# Patient Record
Sex: Female | Born: 2000
Health system: Southern US, Community
[De-identification: ages and names within clinical notes are randomized; demographics above are authoritative.]

## PROBLEM LIST (undated history)

## (undated) DIAGNOSIS — R221 Localized swelling, mass and lump, neck: Secondary | ICD-10-CM

## (undated) DIAGNOSIS — G43909 Migraine, unspecified, not intractable, without status migrainosus: Secondary | ICD-10-CM

## (undated) DIAGNOSIS — F419 Anxiety disorder, unspecified: Secondary | ICD-10-CM

## (undated) DIAGNOSIS — R09A2 Foreign body sensation, throat: Secondary | ICD-10-CM

## (undated) DIAGNOSIS — E739 Lactose intolerance, unspecified: Secondary | ICD-10-CM

## (undated) HISTORY — DX: Migraine, unspecified, not intractable, without status migrainosus: G43.909

## (undated) HISTORY — DX: Foreign body sensation, throat: R09.A2

## (undated) HISTORY — DX: Localized swelling, mass and lump, neck: R22.1

---
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
## 2018-01-11 DIAGNOSIS — F411 Generalized anxiety disorder: Secondary | ICD-10-CM | POA: Diagnosis not present

## 2018-02-05 DIAGNOSIS — F419 Anxiety disorder, unspecified: Secondary | ICD-10-CM | POA: Diagnosis not present

## 2018-02-08 DIAGNOSIS — F411 Generalized anxiety disorder: Secondary | ICD-10-CM | POA: Diagnosis not present

## 2018-03-15 DIAGNOSIS — F411 Generalized anxiety disorder: Secondary | ICD-10-CM | POA: Diagnosis not present

## 2018-04-10 DIAGNOSIS — F411 Generalized anxiety disorder: Secondary | ICD-10-CM | POA: Diagnosis not present

## 2018-05-23 DIAGNOSIS — F411 Generalized anxiety disorder: Secondary | ICD-10-CM | POA: Diagnosis not present

## 2018-06-28 DIAGNOSIS — F419 Anxiety disorder, unspecified: Secondary | ICD-10-CM | POA: Diagnosis not present

## 2018-07-04 DIAGNOSIS — F411 Generalized anxiety disorder: Secondary | ICD-10-CM | POA: Diagnosis not present

## 2018-08-07 DIAGNOSIS — F411 Generalized anxiety disorder: Secondary | ICD-10-CM | POA: Diagnosis not present

## 2018-08-28 DIAGNOSIS — F411 Generalized anxiety disorder: Secondary | ICD-10-CM | POA: Diagnosis not present

## 2018-10-12 DIAGNOSIS — F411 Generalized anxiety disorder: Secondary | ICD-10-CM | POA: Diagnosis not present

## 2018-10-17 DIAGNOSIS — F419 Anxiety disorder, unspecified: Secondary | ICD-10-CM | POA: Diagnosis not present

## 2018-11-13 DIAGNOSIS — F411 Generalized anxiety disorder: Secondary | ICD-10-CM | POA: Diagnosis not present

## 2018-12-05 DIAGNOSIS — F411 Generalized anxiety disorder: Secondary | ICD-10-CM | POA: Diagnosis not present

## 2019-01-02 DIAGNOSIS — F411 Generalized anxiety disorder: Secondary | ICD-10-CM | POA: Diagnosis not present

## 2019-01-23 ENCOUNTER — Other Ambulatory Visit: Payer: Self-pay

## 2019-01-23 ENCOUNTER — Encounter: Payer: Self-pay | Admitting: Certified Nurse Midwife

## 2019-01-23 ENCOUNTER — Ambulatory Visit: Payer: BLUE CROSS/BLUE SHIELD | Admitting: Certified Nurse Midwife

## 2019-01-23 VITALS — BP 118/80 | HR 68 | Resp 16 | Ht 59.0 in | Wt 157.0 lb

## 2019-01-23 DIAGNOSIS — Z30011 Encounter for initial prescription of contraceptive pills: Secondary | ICD-10-CM

## 2019-01-23 DIAGNOSIS — Z01419 Encounter for gynecological examination (general) (routine) without abnormal findings: Secondary | ICD-10-CM

## 2019-01-23 MED ORDER — NORETHIN ACE-ETH ESTRAD-FE 1-20 MG-MCG PO TABS: 1.0000 | ORAL_TABLET | Freq: Every day | ORAL | 3 refills | Status: DC

## 2019-01-23 NOTE — Patient Instructions (Signed)
Oral Contraception Use  Oral contraceptive pills (OCPs) are medicines that you take to prevent pregnancy. OCPs work by:  · Preventing the ovaries from releasing eggs.  · Thickening mucus in the lower part of the uterus (cervix), which prevents sperm from entering the uterus.  · Thinning the lining of the uterus (endometrium), which prevents a fertilized egg from attaching to the endometrium.  OCPs are highly effective when taken exactly as prescribed. However, OCPs do not prevent sexually transmitted infections (STIs). Safe sex practices, such as using condoms while on an OCP, can help prevent STIs.  Before taking OCPs, you may have a physical exam, blood test, and Pap test. A Pap test involves taking a sample of cells from your cervix to check for cancer. Discuss with your health care provider the possible side effects of the OCP you may be prescribed. When you start an OCP, be aware that it can take 2-3 months for your body to adjust to changes in hormone levels.  How to take oral contraceptive pills  Follow instructions from your health care provider about how to start taking your first cycle of OCPs. Your health care provider may recommend that you:  · Start the pill on day 1 of your menstrual period. If you start at this time, you will not need any backup form of birth control (contraception), such as condoms.  · Start the pill on the first Sunday after your menstrual period or on the day you get your prescription. In these cases, you will need to use backup contraception for the first week.  · Start the pill at any time of your cycle.  ? If you take the pill within 5 days of the start of your period, you will not need a backup form of contraception.  ? If you start at any other time of your menstrual cycle, you will need to use another form of contraception for 7 days. If your OCP is the type called a minipill, it will protect you from pregnancy after taking it for 2 days (48 hours), and you can stop using  backup contraception after that time.  After you have started taking OCPs:  · If you forget to take 1 pill, take it as soon as you remember. Take the next pill at the regular time.  · If you miss 2 or more pills, call your health care provider. Different pills have different instructions for missed doses. Use backup birth control until your next menstrual period starts.  · If you use a 28-day pack that contains inactive pills and you miss 1 of the last 7 pills (pills with no hormones), throw away the rest of the non-hormone pills and start a new pill pack.  No matter which day you start the OCP, you will always start a new pack on that same day of the week. Have an extra pack of OCPs and a backup contraceptive method available in case you miss some pills or lose your OCP pack.  Follow these instructions at home:  · Do not use any products that contain nicotine or tobacco, such as cigarettes and e-cigarettes. If you need help quitting, ask your health care provider.  · Always use a condom to protect against STIs. OCPs do not protect against STIs.  · Use a calendar to mark the days of your menstrual period.  · Read the information and directions that came with your OCP. Talk to your health care provider if you have questions.  Contact a   health care provider if:  · You develop nausea and vomiting.  · You have abnormal vaginal discharge or bleeding.  · You develop a rash.  · You miss your menstrual period. Depending on the type of OCP you are taking, this may be a sign of pregnancy. Ask your health care provider for more information.  · You are losing your hair.  · You need treatment for mood swings or depression.  · You get dizzy when taking the OCP.  · You develop acne after taking the OCP.  · You become pregnant or think you may be pregnant.  · You have diarrhea, constipation, and abdominal pain or cramps.  · You miss 2 or more pills.  Get help right away if:  · You develop chest pain.  · You develop shortness of  breath.  · You have an uncontrolled or severe headache.  · You develop numbness or slurred speech.  · You develop visual or speech problems.  · You develop pain, redness, and swelling in your legs.  · You develop weakness or numbness in your arms or legs.  Summary  · Oral contraceptive pills (OCPs) are medicines that you take to prevent pregnancy.  · OCPs do not prevent sexually transmitted infections (STIs). Always use a condom to protect against STIs.  · When you start an OCP, be aware that it can take 2-3 months for your body to adjust to changes in hormone levels.  · Read all the information and directions that come with your OCP.  This information is not intended to replace advice given to you by your health care provider. Make sure you discuss any questions you have with your health care provider.  Document Released: 11/03/2011 Document Revised: 12/26/2016 Document Reviewed: 12/26/2016  Elsevier Interactive Patient Education © 2019 Elsevier Inc.

## 2019-01-23 NOTE — Progress Notes (Signed)
18 y.o. G0P0000 Single  Caucasian Fe here for annual exam. Now attending [employer] and working part time. Sexually active, oral only. Dr. Kizzie Bane manages her Prozac and Vistaril, working well. Sees Urgent care if needed.Interested in contraception OCP now, she has consistent partner. No STD screening needed at this point. She has read about OCP and feels this would be good option. No other health issues today.  No LMP recorded.          Sexually active: only oral not vaginal intercourse  The current method of family planning is abstinence.    Exercising: No.  exercise Smoker:  no  Review of Systems  Constitutional: Negative.   HENT: Negative.   Eyes: Negative.   Respiratory: Negative.   Cardiovascular: Negative.   Gastrointestinal: Negative.   Genitourinary: Negative.   Musculoskeletal: Negative.   Skin: Negative.   Neurological: Negative.   Endo/Heme/Allergies: Negative.   Psychiatric/Behavioral: Negative.     Health Maintenance: Pap:  none History of Abnormal Pap: no MMG:  none Self Breast exams: no Colonoscopy:  none BMD:   none TDaP: 2013 Shingles: no Pneumonia: no Hep C and HIV: not done Labs: if needed   reports that she has never smoked. She has never used smokeless tobacco. She reports that she does not drink alcohol or use drugs.  Past Medical History:  Diagnosis Date  . Anxiety   . Lactose intolerance   . Sensation of lump in throat     No past surgical history on file.  Current Outpatient Medications  Medication Sig Dispense Refill  . cetirizine (ZYRTEC) 10 MG tablet Take 10 mg by mouth as needed for allergies.    Marland Kitchen FLUoxetine (PROZAC) 20 MG capsule Take 20 mg by mouth daily.    . hydrOXYzine (VISTARIL) 25 MG capsule TAKE 1 TABLET EVERY 6 HOURS AS NEEDED FOR ANXIETY  1   No current facility-administered medications for this visit.     Family History  Problem Relation Age of Onset  . Ulcers Paternal Uncle   . Osteopenia Mother   . Hypertension Father    . Uterine cancer Maternal Grandmother 25  . Heart disease Maternal Grandfather        quad bypass  . Alzheimer's disease Paternal Grandfather     ROS:  Pertinent items are noted in HPI.  Otherwise, a comprehensive ROS was negative.  Exam:   There were no vitals taken for this visit.   Ht Readings from Last 3 Encounters:  03/08/16 4' 11.25" (1.505 m) (3 %, Z= -1.92)*  09/25/13 4' 10.9" (1.496 m) (3 %, Z= -1.82)*  09/16/13 4' 10.5" (1.486 m) (2 %, Z= -1.97)*   * Growth percentiles are based on CDC (Girls, 2-20 Years) data.    General appearance: alert, cooperative and appears stated age Head: Normocephalic, without obvious abnormality, atraumatic Neck: no adenopathy, supple, symmetrical, trachea midline and thyroid normal to inspection and palpation Lungs: clear to auscultation bilaterally Breasts: normal appearance, no masses or tenderness, No nipple retraction or dimpling, No nipple discharge or bleeding, No axillary or supraclavicular adenopathy, Taught monthly breast self examination Heart: regular rate and rhythm Abdomen: soft, non-tender; no masses,  no organomegaly Extremities: extremities normal, atraumatic, no cyanosis or edema Skin: Skin color, texture, turgor normal. No rashes or lesions Lymph nodes: Cervical, supraclavicular, and axillary nodes normal. No abnormal inguinal nodes palpated Neurologic: Grossly normal   Pelvic: External genitalia:  no lesions,  Normal female  Urethra:  normal appearing urethra with no masses, tenderness or lesions              Bartholin's and Skene's: normal                 Vagina: normal appearing vagina with normal color and discharge, no lesions              Cervix: no cervical motion tenderness, no lesions and nulliparous appearance              Pap taken: No. Bimanual Exam:  Uterus:  normal size, contour, position, consistency, mobility, non-tender and anteverted              Adnexa: normal adnexa and no mass, fullness,  tenderness               Rectovaginal: Confirms               Anus:  normal appearance, no lesions  Chaperone present: yes  A:  Well Woman with normal exam  Contraception desired for cycle control as well as contraception, requests OCP  Anxiety management with MD, stable medication    P:   Reviewed health and wellness pertinent to exam  Discussed risks/benefits/warning signs and expectations for use OCP.   Rx Junel  1/20 Fe, see order with instructions to start today, on menses . Needs follow up visit in 3 months to assess OCP status.  Pap smear: no   counseled on breast self exam, STD prevention, HIV risk factors and prevention, feminine hygiene, use and side effects of OCP's, adequate intake of calcium and vitamin D, diet and exercise  return annually or prn, as above  An After Visit Summary was printed and given to the patient.

## 2019-01-30 DIAGNOSIS — F411 Generalized anxiety disorder: Secondary | ICD-10-CM | POA: Diagnosis not present

## 2019-02-14 DIAGNOSIS — F411 Generalized anxiety disorder: Secondary | ICD-10-CM | POA: Diagnosis not present

## 2019-02-27 DIAGNOSIS — F411 Generalized anxiety disorder: Secondary | ICD-10-CM | POA: Diagnosis not present

## 2019-03-13 DIAGNOSIS — F411 Generalized anxiety disorder: Secondary | ICD-10-CM | POA: Diagnosis not present

## 2019-03-27 DIAGNOSIS — F411 Generalized anxiety disorder: Secondary | ICD-10-CM | POA: Diagnosis not present

## 2019-04-18 ENCOUNTER — Other Ambulatory Visit: Payer: Self-pay

## 2019-04-18 ENCOUNTER — Ambulatory Visit: Payer: BLUE CROSS/BLUE SHIELD | Admitting: Obstetrics and Gynecology

## 2019-04-18 ENCOUNTER — Encounter: Payer: Self-pay | Admitting: Obstetrics and Gynecology

## 2019-04-18 DIAGNOSIS — Z3041 Encounter for surveillance of contraceptive pills: Secondary | ICD-10-CM | POA: Diagnosis not present

## 2019-04-18 MED ORDER — NORETHIN ACE-ETH ESTRAD-FE 1-20 MG-MCG PO TABS: 1.0000 | ORAL_TABLET | Freq: Every day | ORAL | 2 refills | Status: DC

## 2019-04-18 MED ORDER — IBUPROFEN 800 MG PO TABS: 800.0000 mg | ORAL_TABLET | Freq: Three times a day (TID) | ORAL | 1 refills | Status: AC | PRN

## 2019-04-18 NOTE — Progress Notes (Signed)
GYNECOLOGY  VISIT   HPI: 18 y.o.   Single  Caucasian  female   G0P0000 with Patient's last menstrual period was 04/12/2019.   here for 3 month med recheck. Patient states that she has been a emotional but overall doing well with the pills.    States her menses are lighter and shorter.   3 - 5 days instead of 5 - 7 days.  Using a menstrual cup also.  Cycles are consistent.  Still has some cramping but lasts for 1 - 2 days only.  Midol works to treat cramping.   Thinks she had a migraine this week.  States the headache would come and go.  She had light sensitivity.  No hx of migraines.   Feeling more emotional in the month of April.  Separated from boyfriend during Covid 9019.  Feels like this is better during the last 2 weeks.   Taking Prozac through psychiatry.  Student at [employer].  Works at [employer].   GYNECOLOGIC HISTORY: Patient's last menstrual period was 04/12/2019. Contraception: JUNEL FE Menopausal hormone therapy:  n/a Last mammogram:  n/a Last pap smear:   n/a        OB History    Gravida  0   Para  0   Term  0   Preterm  0   AB  0   Living  0     SAB  0   TAB  0   Ectopic  0   Multiple  0   Live Births                 Patient Active Problem List   Diagnosis Date Noted  . Sensation of lump in throat     Past Medical History:  Diagnosis Date  . Anxiety   . Lactose intolerance   . Sensation of lump in throat     No past surgical history on file.  Current Outpatient Medications  Medication Sig Dispense Refill  . cetirizine (ZYRTEC) 10 MG tablet Take 10 mg by mouth as needed for allergies.    Marland Kitchen. FLUoxetine (PROZAC) 20 MG capsule Take 20 mg by mouth daily.    . hydrOXYzine (VISTARIL) 25 MG capsule TAKE 1 TABLET EVERY 6 HOURS AS NEEDED FOR ANXIETY  1  . norethindrone-ethinyl estradiol (JUNEL FE,GILDESS FE,LOESTRIN FE) 1-20 MG-MCG tablet Take 1 tablet by mouth daily. 1 Package 3   No current facility-administered medications for  this visit.      ALLERGIES: Banana  Family History  Problem Relation Age of Onset  . Uterine cancer Maternal Grandmother [id number]  . Heart disease Maternal Grandfather        quad bypass  . Alzheimer's disease Paternal Grandfather   . Ulcers Paternal Uncle   . Osteopenia Mother   . Hypertension Father     Social History   Socioeconomic History  . Marital status: Single    Spouse name: Not on file  . Number of children: Not on file  . Years of education: Not on file  . Highest education level: Not on file  Occupational History  . Not on file  Social Needs  . Financial resource strain: Not on file  . Food insecurity:    Worry: Not on file    Inability: Not on file  . Transportation needs:    Medical: Not on file    Non-medical: Not on file  Tobacco Use  . Smoking status: Never Smoker  . Smokeless tobacco: Never Used  Substance and Sexual Activity  . Alcohol use: No    Alcohol/week: 0.0 standard drinks  . Drug use: No  . Sexual activity: Never    Partners: Male    Birth control/protection: Abstinence    Comment: oral but not vaginal  Lifestyle  . Physical activity:    Days per week: Not on file    Minutes per session: Not on file  . Stress: Not on file  Relationships  . Social connections:    Talks on phone: Not on file    Gets together: Not on file    Attends religious service: Not on file    Active member of club or organization: Not on file    Attends meetings of clubs or organizations: Not on file    Relationship status: Not on file  . Intimate partner violence:    Fear of current or ex partner: Not on file    Emotionally abused: Not on file    Physically abused: Not on file    Forced sexual activity: Not on file  Other Topics Concern  . Not on file  Social History Narrative   6th grade 2014-2015    Review of Systems  Constitutional: Negative.   HENT: Negative.   Eyes: Negative.   Respiratory: Negative.   Cardiovascular: Negative.    Gastrointestinal: Negative.   Endocrine: Negative.   Genitourinary: Negative.   Musculoskeletal: Negative.   Skin: Negative.   Allergic/Immunologic: Negative.   Neurological: Negative.   Hematological: Negative.   Psychiatric/Behavioral: Negative.     PHYSICAL EXAMINATION:    BP 120/76 (BP Location: Right Arm, Patient Position: Sitting, Cuff Size: Large)   Pulse 84   Temp (!) 97.4 F (36.3 C) (Temporal)   Resp 14   Ht 4\' 11"  (1.499 m)   Wt 161 lb 9.6 oz (73.3 kg)   LMP 04/12/2019   BMI 32.64 kg/m     General appearance: alert, cooperative and appears stated age   ASSESSMENT  COC monitoring.  Isolated migraine HA. Dysmenorrhea.   PLAN  Motrin 800 mg po q 8 hour prn.  Refill of LoEstrin 1/20 3 packs and 2 refills.  She will contact the office if she develops migraines with aura.  I discussed increased risk of stroke in this circumstance and the need for switch to a nonestrogen containing method of contraception.  FU for annual exam with Sara Chu in Feb. 2021 and prn.    An After Visit Summary was printed and given to the patient.  ____15__ minutes face to face time of which over 50% was spent in counseling.

## 2019-04-24 ENCOUNTER — Ambulatory Visit: Payer: BLUE CROSS/BLUE SHIELD | Admitting: Certified Nurse Midwife

## 2019-04-24 DIAGNOSIS — F411 Generalized anxiety disorder: Secondary | ICD-10-CM | POA: Diagnosis not present

## 2019-05-01 ENCOUNTER — Other Ambulatory Visit: Payer: Self-pay | Admitting: Certified Nurse Midwife

## 2019-05-01 DIAGNOSIS — Z3041 Encounter for surveillance of contraceptive pills: Secondary | ICD-10-CM

## 2019-05-01 NOTE — Telephone Encounter (Signed)
Prescription sent to [facility] 04/18/19; VM left for patient to call me back at [phone] to confirm which pharmacy is correct for medication.

## 2019-05-01 NOTE — Telephone Encounter (Signed)
Spoke with patient regarding refill request. Prescription was sent to the right pharmacy ([facility]). Request from [facility] has been refused. Closing encounter.

## 2019-05-22 DIAGNOSIS — F411 Generalized anxiety disorder: Secondary | ICD-10-CM | POA: Diagnosis not present

## 2019-05-24 DIAGNOSIS — G43909 Migraine, unspecified, not intractable, without status migrainosus: Secondary | ICD-10-CM | POA: Diagnosis not present

## 2019-06-12 DIAGNOSIS — F411 Generalized anxiety disorder: Secondary | ICD-10-CM | POA: Diagnosis not present

## 2019-06-17 DIAGNOSIS — R51 Headache: Secondary | ICD-10-CM | POA: Diagnosis not present

## 2019-07-03 DIAGNOSIS — F411 Generalized anxiety disorder: Secondary | ICD-10-CM | POA: Diagnosis not present

## 2019-07-05 DIAGNOSIS — G43909 Migraine, unspecified, not intractable, without status migrainosus: Secondary | ICD-10-CM | POA: Diagnosis not present

## 2019-07-15 ENCOUNTER — Other Ambulatory Visit: Payer: Self-pay

## 2019-07-15 ENCOUNTER — Ambulatory Visit: Payer: BC Managed Care – PPO | Admitting: Certified Nurse Midwife

## 2019-07-15 ENCOUNTER — Encounter: Payer: Self-pay | Admitting: Certified Nurse Midwife

## 2019-07-15 VITALS — BP 110/68 | HR 64 | Temp 97.0°F | Resp 16 | Wt 163.0 lb

## 2019-07-15 DIAGNOSIS — Z3041 Encounter for surveillance of contraceptive pills: Secondary | ICD-10-CM | POA: Diagnosis not present

## 2019-07-15 DIAGNOSIS — G43009 Migraine without aura, not intractable, without status migrainosus: Secondary | ICD-10-CM

## 2019-07-15 MED ORDER — NORETHINDRONE 0.35 MG PO TABS
1.0000 | ORAL_TABLET | Freq: Every day | ORAL | 2 refills | Status: DC
Start: 2019-07-15 — End: 2019-09-23

## 2019-07-15 NOTE — Progress Notes (Signed)
Review of Systems  Constitutional: Negative.   HENT: Negative.   Eyes:       Wears glasses and prescription is up to date, no eye change  Respiratory: Negative.   Cardiovascular: Negative.   Gastrointestinal: Negative.   Genitourinary: Negative.   Musculoskeletal: Negative.   Skin: Negative.   Neurological: Positive for headaches.       One headache weekly, frontal, no aura, or vision change or nausea  Psychiatric/Behavioral: Negative.     18 y.o. Single Caucasian G0P0000here for evaluation of OCP. Menses duration 3-4 days with moderate to light flow. Patient taking medication as prescribed. Patient has noted headaches with new pack each month and increase in occurrence, once weekly. Denies aura, but is frontal. She saw PCP who felt were related to OCP.  Denies missed pills, aura with  headaches,slight nausea only.Occur with new pack of OCP. Would like to change contraception to see if will resolve. Not sexually active at present. Keeping menses calendar. Would like to stay on pills because she is familiar with them and consistent about taking. No other health issues today  O: Healthy female, WD WN Affect: normal orientation X 3    A: History of new headache occurrence on OCP. Migraine headache no aura with PCP management. Contraception change desired.  P: Discussed concerns with estrogen containing OCP and migraines. Patient is aware of warning signs with migraines. Discussed options of Depo Provera, POP, Nexplanon, IUD. She would prefer POP trial. Expectations with bleeding profile discussed. She has just started her new pack and will stop and start on Micronor. If sexually active needs to use condom back up for protection the first month of change. Rx Micronor  See order with instructions. Patient to call and advise if headaches have resolved or needs to schedule appointment if not adjusting well. Questions addressed.  22 minutes in face to face discussion regarding contraception  management  Rv prn

## 2019-07-15 NOTE — Patient Instructions (Signed)

## 2019-07-25 DIAGNOSIS — F411 Generalized anxiety disorder: Secondary | ICD-10-CM | POA: Diagnosis not present

## 2019-08-15 DIAGNOSIS — F419 Anxiety disorder, unspecified: Secondary | ICD-10-CM | POA: Diagnosis not present

## 2019-08-15 DIAGNOSIS — F411 Generalized anxiety disorder: Secondary | ICD-10-CM | POA: Diagnosis not present

## 2019-08-15 DIAGNOSIS — F4322 Adjustment disorder with anxiety: Secondary | ICD-10-CM | POA: Diagnosis not present

## 2019-09-23 ENCOUNTER — Other Ambulatory Visit: Payer: Self-pay | Admitting: Certified Nurse Midwife

## 2019-09-23 DIAGNOSIS — Z3041 Encounter for surveillance of contraceptive pills: Secondary | ICD-10-CM

## 2019-09-23 MED ORDER — NORETHINDRONE 0.35 MG PO TABS: 1.0000 | ORAL_TABLET | Freq: Every day | ORAL | 5 refills | Status: DC

## 2019-09-23 NOTE — Telephone Encounter (Signed)
Patient is calling requesting a refill for norethindrone. Patient stated that she "rarely" gets headaches since starting the medication and would like to continue with it.

## 2019-09-23 NOTE — Telephone Encounter (Signed)
aex was 01-23-2019. 52mth supply with 0 refills was given 07-15-2019. Please approve refill if appropriate.

## 2019-10-07 DIAGNOSIS — F411 Generalized anxiety disorder: Secondary | ICD-10-CM | POA: Diagnosis not present

## 2019-12-26 DIAGNOSIS — F411 Generalized anxiety disorder: Secondary | ICD-10-CM | POA: Diagnosis not present

## 2020-01-23 DIAGNOSIS — F411 Generalized anxiety disorder: Secondary | ICD-10-CM | POA: Diagnosis not present

## 2020-01-28 ENCOUNTER — Other Ambulatory Visit: Payer: Self-pay

## 2020-01-29 ENCOUNTER — Other Ambulatory Visit (HOSPITAL_COMMUNITY)
Admission: RE | Admit: 2020-01-29 | Discharge: 2020-01-29 | Disposition: A | Discharge status: Home / Self Care | Payer: BC Managed Care – PPO | Source: Ambulatory Visit | Attending: Certified Nurse Midwife | Admitting: Certified Nurse Midwife

## 2020-01-29 ENCOUNTER — Other Ambulatory Visit: Payer: Self-pay

## 2020-01-29 ENCOUNTER — Ambulatory Visit: Payer: BC Managed Care – PPO | Admitting: Certified Nurse Midwife

## 2020-01-29 ENCOUNTER — Encounter: Payer: Self-pay | Admitting: Certified Nurse Midwife

## 2020-01-29 VITALS — BP 120/70 | HR 72 | Temp 98.4°F | Resp 16 | Ht 59.25 in | Wt 171.0 lb

## 2020-01-29 DIAGNOSIS — Z01419 Encounter for gynecological examination (general) (routine) without abnormal findings: Secondary | ICD-10-CM

## 2020-01-29 DIAGNOSIS — Z3041 Encounter for surveillance of contraceptive pills: Secondary | ICD-10-CM

## 2020-01-29 DIAGNOSIS — Z124 Encounter for screening for malignant neoplasm of cervix: Secondary | ICD-10-CM

## 2020-01-29 MED ORDER — NORETHINDRONE 0.35 MG PO TABS: 1.0000 | ORAL_TABLET | Freq: Every day | ORAL | 11 refills | Status: DC

## 2020-01-29 NOTE — Progress Notes (Signed)
19 y.o. G0P0000 Single  Caucasian Fe here for annual exam. Periods regular 3-4 days with no cramping. Micronor working well for cycle control. Headaches have improved and no more migraines. Never sexually active. Using Diva cup for menses..Busy with college. No health issues today.   Patient's last menstrual period was 01/22/2020 (exact date).          Sexually active: No. never sexually active The current method of family planning is oral progesterone-only contraceptive.    Exercising: No.  exercise Smoker:  no  Review of Systems  Constitutional: Negative.   HENT: Negative.   Eyes: Negative.   Respiratory: Negative.   Cardiovascular: Negative.   Gastrointestinal: Negative.   Genitourinary: Negative.   Musculoskeletal: Negative.   Skin: Negative.   Neurological: Negative.   Endo/Heme/Allergies: Negative.   Psychiatric/Behavioral: Negative.     Health Maintenance: Pap:  none History of Abnormal Pap: no MMG:  none Self Breast exams: no Colonoscopy:  none BMD:   none TDaP:  2013 Shingles: no Pneumonia: no Hep C and HIV: not done Labs: if needed   reports that she has never smoked. She has never used smokeless tobacco. She reports that she does not drink alcohol or use drugs.  Past Medical History:  Diagnosis Date  . Anxiety   . Lactose intolerance   . Migraines   . Sensation of lump in throat     History reviewed. No pertinent surgical history.  Current Outpatient Medications  Medication Sig Dispense Refill  . cetirizine (ZYRTEC) 10 MG tablet Take 10 mg by mouth as needed for allergies.    Marland Kitchen FLUoxetine (PROZAC) 20 MG tablet Take 20 mg by mouth daily.    . hydrOXYzine (VISTARIL) 25 MG capsule TAKE 1 TABLET EVERY 6 HOURS AS NEEDED FOR ANXIETY  1  . ibuprofen (ADVIL) 800 MG tablet Take 1 tablet (800 mg total) by mouth every 8 (eight) hours as needed. 90 tablet 1  . norethindrone (MICRONOR) 0.35 MG tablet Take 1 tablet (0.35 mg total) by mouth daily. 1 Package 5   No  current facility-administered medications for this visit.    Family History  Problem Relation Age of Onset  . Uterine cancer Maternal Grandmother 48  . Heart disease Maternal Grandfather        quad bypass  . Alzheimer's disease Paternal Grandfather   . Ulcers Paternal Uncle   . Osteopenia Mother   . Hypertension Father     ROS:  Pertinent items are noted in HPI.  Otherwise, a comprehensive ROS was negative.  Exam:   BP 120/70   Pulse 72   Temp 98.4 F (36.9 C) (Skin)   Resp 16   Ht 4' 11.25" (1.505 m)   Wt 171 lb (77.6 kg)   LMP 01/22/2020 (Exact Date)   BMI 34.25 kg/m  Height: 4' 11.25" (150.5 cm) Ht Readings from Last 3 Encounters:  21/03/21 4' 11.25" (1.505 m)  04/18/19 4\' 11"  (1.499 m)  01/23/19 4\' 11"  (1.499 m) (2 %, Z= -2.07)*   * Growth percentiles are based on CDC (Girls, 2-20 Years) data.    General appearance: alert, cooperative and appears stated age Head: Normocephalic, without obvious abnormality, atraumatic Neck: no adenopathy, supple, symmetrical, trachea midline and thyroid normal to inspection and palpation Lungs: clear to auscultation bilaterally Breasts: normal appearance, no masses or tenderness, No nipple retraction or dimpling, No nipple discharge or bleeding, No axillary or supraclavicular adenopathy, Taught monthly breast self examination Heart: regular rate and rhythm Abdomen: soft, non-tender;  no masses,  no organomegaly Extremities: extremities normal, atraumatic, no cyanosis or edema Skin: Skin color, texture, turgor normal. No rashes or lesions Lymph nodes: Cervical, supraclavicular, and axillary nodes normal. No abnormal inguinal nodes palpated Neurologic: Grossly normal   Pelvic: External genitalia:  no lesions              Urethra:  normal appearing urethra with no masses, tenderness or lesions              Bartholin's and Skene's: normal                 Vagina: normal appearing vagina with normal color and discharge, no lesions               Cervix: no cervical motion tenderness, no lesions and nulliparous appearance              Pap taken: Yes.   Bimanual Exam:  Uterus:  normal size, contour, position, consistency, mobility, non-tender and anteverted              Adnexa: normal adnexa and no mass, fullness, tenderness               Rectovaginal: Confirms               Anus:  normal appearance, no lesions  Chaperone present: yes  A:  Well Woman with normal exam  Contraception Micronor  History of Migraine headaches, no aura  Weight gain  P:   Reviewed health and wellness pertinent to exam  Risks/benefits/warning signs with use and bleeding expectations reviewed, happy with choice  Rx Micronor see order with instructions  Discussed Migraine aura and this occurs needs to be seen by headache specialist. Patient denies aura ever.  Encouraged to work on healthy food choices to control weight gain.  Pap smear: yes   counseled on breast self exam, STD prevention, HIV risk factors and prevention, feminine hygiene, use and side effects of OCP's, adequate intake of calcium and vitamin D, diet and exercise  return annually or prn  An After Visit Summary was printed and given to the patient.

## 2020-01-29 NOTE — Patient Instructions (Signed)
General topics  Next pap or exam is  due in 1 year Take a Women's multivitamin Take 1200 mg. of calcium daily - prefer dietary If any concerns in interim to call back  Breast Self-Awareness Practicing breast self-awareness may pick up problems early, prevent significant medical complications, and possibly save your life. By practicing breast self-awareness, you can become familiar with how your breasts look and feel and if your breasts are changing. This allows you to notice changes early. It can also offer you some reassurance that your breast health is good. One way to learn what is normal for your breasts and whether your breasts are changing is to do a breast self-exam. If you find a lump or something that was not present in the past, it is best to contact your caregiver right away. Other findings that should be evaluated by your caregiver include nipple discharge, especially if it is bloody; skin changes or reddening; areas where the skin seems to be pulled in (retracted); or new lumps and bumps. Breast pain is seldom associated with cancer (malignancy), but should also be evaluated by a caregiver. BREAST SELF-EXAM The best time to examine your breasts is 5 7 days after your menstrual period is over.  ExitCare Patient Information 2013 [employer].   Exercise to Stay Healthy Exercise helps you become and stay healthy. EXERCISE IDEAS AND TIPS Choose exercises that:  You enjoy.  Fit into your day. You do not need to exercise really hard to be healthy. You can do exercises at a slow or medium level and stay healthy. You can:  Stretch before and after working out.  Try yoga, Pilates, or tai chi.  Lift weights.  Walk fast, swim, jog, run, climb stairs, bicycle, dance, or rollerskate.  Take aerobic classes. Exercises that burn about 150 calories:  Running 1  miles in 15 minutes.  Playing volleyball for 45 to 60 minutes.  Washing and waxing a car for 45 to 60  minutes.  Playing touch football for 45 minutes.  Walking 1  miles in 35 minutes.  Pushing a stroller 1  miles in 30 minutes.  Playing basketball for 30 minutes.  Raking leaves for 30 minutes.  Bicycling 5 miles in 30 minutes.  Walking 2 miles in 30 minutes.  Dancing for 30 minutes.  Shoveling snow for 15 minutes.  Swimming laps for 20 minutes.  Walking up stairs for 15 minutes.  Bicycling 4 miles in 15 minutes.  Gardening for 30 to 45 minutes.  Jumping rope for 15 minutes.  Washing windows or floors for 45 to 60 minutes. Document Released: 12/17/2010 Document Revised: 02/06/2012 Document Reviewed: 12/17/2010 ExitCare Patient Information 2013 [employer].   Other topics ( that may be useful information):    Sexually Transmitted Disease Sexually transmitted disease (STD) refers to any infection that is passed from person to person during sexual activity. This may happen by way of saliva, semen, blood, vaginal mucus, or urine. Common STDs include:  Gonorrhea.  Chlamydia.  Syphilis.  HIV/AIDS.  Genital herpes.  Hepatitis B and C.  Trichomonas.  Human papillomavirus (HPV).  Pubic lice. CAUSES  An STD may be spread by bacteria, virus, or parasite. A person can get an STD by:  Sexual intercourse with an infected person.  Sharing sex toys with an infected person.  Sharing needles with an infected person.  Having intimate contact with the genitals, mouth, or rectal areas of an infected person. SYMPTOMS  Some people may not have any symptoms, but   they can still pass the infection to others. Different STDs have different symptoms. Symptoms include:  Painful or bloody urination.  Pain in the pelvis, abdomen, vagina, anus, throat, or eyes.  Skin rash, itching, irritation, growths, or sores (lesions). These usually occur in the genital or anal area.  Abnormal vaginal discharge.  Penile discharge in men.  Soft, flesh-colored skin growths in the  genital or anal area.  Fever.  Pain or bleeding during sexual intercourse.  Swollen glands in the groin area.  Yellow skin and eyes (jaundice). This is seen with hepatitis. DIAGNOSIS  To make a diagnosis, your caregiver may:  Take a medical history.  Perform a physical exam.  Take a specimen (culture) to be examined.  Examine a sample of discharge under a microscope.  Perform blood test TREATMENT   Chlamydia, gonorrhea, trichomonas, and syphilis can be cured with antibiotic medicine.  Genital herpes, hepatitis, and HIV can be treated, but not cured, with prescribed medicines. The medicines will lessen the symptoms.  Genital warts from HPV can be treated with medicine or by freezing, burning (electrocautery), or surgery. Warts may come back.  HPV is a virus and cannot be cured with medicine or surgery.However, abnormal areas may be followed very closely by your caregiver and may be removed from the cervix, vagina, or vulva through office procedures or surgery. If your diagnosis is confirmed, your recent sexual partners need treatment. This is true even if they are symptom-free or have a negative culture or evaluation. They should not have sex until their caregiver says it is okay. HOME CARE INSTRUCTIONS  All sexual partners should be informed, tested, and treated for all STDs.  Take your antibiotics as directed. Finish them even if you start to feel better.  Only take over-the-counter or prescription medicines for pain, discomfort, or fever as directed by your caregiver.  Rest.  Eat a balanced diet and drink enough fluids to keep your urine clear or pale yellow.  Do not have sex until treatment is completed and you have followed up with your caregiver. STDs should be checked after treatment.  Keep all follow-up appointments, Pap tests, and blood tests as directed by your caregiver.  Only use latex condoms and water-soluble lubricants during sexual activity. Do not use  petroleum jelly or oils.  Avoid alcohol and illegal drugs.  Get vaccinated for HPV and hepatitis. If you have not received these vaccines in the past, talk to your caregiver about whether one or both might be right for you.  Avoid risky sex practices that can break the skin. The only way to avoid getting an STD is to avoid all sexual activity.Latex condoms and dental dams (for oral sex) will help lessen the risk of getting an STD, but will not completely eliminate the risk. SEEK MEDICAL CARE IF:   You have a fever.  You have any new or worsening symptoms. Document Released: 02/04/2003 Document Revised: 02/06/2012 Document Reviewed: 02/11/2011 [facility] Patient Information 2013 [place].    Domestic Abuse You are being battered or abused if someone close to you hits, pushes, or physically hurts you in any way. You also are being abused if you are forced into activities. You are being sexually abused if you are forced to have sexual contact of any kind. You are being emotionally abused if you are made to feel worthless or if you are constantly threatened. It is important to remember that help is available. No one has the right to abuse you. PREVENTION OF FURTHER  ABUSE  Learn the warning signs of danger. This varies with situations but may include: the use of alcohol, threats, isolation from friends and family, or forced sexual contact. Leave if you feel that violence is going to occur.  If you are attacked or beaten, report it to the police so the abuse is documented. You do not have to press charges. The police can protect you while you or the attackers are leaving. Get the officer's name and badge number and a copy of the report.  Find someone you can trust and tell them what is happening to you: your caregiver, a nurse, clergy member, close friend or family member. Feeling ashamed is natural, but remember that you have done nothing wrong. No one deserves abuse. Document Released:  11/11/2000 Document Revised: 02/06/2012 Document Reviewed: 01/20/2011 ExitCare Patient Information 2013 [employer].    How Much is Too Much Alcohol? Drinking too much alcohol can cause injury, accidents, and health problems. These types of problems can include:   Car crashes.  Falls.  Family fighting (domestic violence).  Drowning.  Fights.  Injuries.  Burns.  Damage to certain organs.  Having a baby with birth defects. ONE DRINK CAN BE TOO MUCH WHEN YOU ARE:  Working.  Pregnant or breastfeeding.  Taking medicines. Ask your doctor.  Driving or planning to drive. If you or someone you know has a drinking problem, get help from a doctor.  Document Released: 09/10/2009 Document Revised: 02/06/2012 Document Reviewed: 09/10/2009 ExitCare Patient Information 2013 [employer]Care[employer].   Smoking Hazards Smoking cigarettes is extremely bad for your health. Tobacco smoke has over 200 known poisons in it. There are over 60 chemicals in tobacco smoke that cause cancer. Some of the chemicals found in cigarette smoke include:   Cyanide.  Benzene.  Formaldehyde.  Methanol (wood alcohol).  Acetylene (fuel used in welding torches).  Ammonia. Cigarette smoke also contains the poisonous gases nitrogen oxide and carbon monoxide.  Cigarette smokers have an increased risk of many serious medical problems and Smoking causes approximately:  90% of all lung cancer deaths in men.  80% of all lung cancer deaths in women.  90% of deaths from chronic obstructive lung disease. Compared with nonsmokers, smoking increases the risk of:  Coronary heart disease by 2 to 4 times.  Stroke by 2 to 4 times.  Men developing lung cancer by 23 times.  Women developing lung cancer by 13 times.  Dying from chronic obstructive lung diseases by 12 times.  . Smoking is the most preventable cause of death and disease in our society.  WHY IS SMOKING ADDICTIVE?  Nicotine is the chemical  agent in tobacco that is capable of causing addiction or dependence.  When you smoke and inhale, nicotine is absorbed rapidly into the bloodstream through your lungs. Nicotine absorbed through the lungs is capable of creating a powerful addiction. Both inhaled and non-inhaled nicotine may be addictive.  Addiction studies of cigarettes and spit tobacco show that addiction to nicotine occurs mainly during the teen years, when young people begin using tobacco products. WHAT ARE THE BENEFITS OF QUITTING?  There are many health benefits to quitting smoking.   Likelihood of developing cancer and heart disease decreases. Health improvements are seen almost immediately.  Blood pressure, pulse rate, and breathing patterns start returning to normal soon after quitting. QUITTING SMOKING   American Lung Association - 1-800-LUNGUSA  American Cancer Society - 1-800-ACS-2345 Document Released: 12/22/2004 Document Revised: 02/06/2012 Document Reviewed: 08/26/2009 ExitCare Patient Information 2013 ExitCare,   LLC.   Stress Management Stress is a state of physical or mental tension that often results from changes in your life or normal routine. Some common causes of stress are:  Death of a loved one.  Injuries or severe illnesses.  Getting fired or changing jobs.  Moving into a new home. Other causes may be:  Sexual problems.  Business or financial losses.  Taking on a large debt.  Regular conflict with someone at home or at work.  Constant tiredness from lack of sleep. It is not just bad things that are stressful. It may be stressful to:  Win the lottery.  Get married.  Buy a new car. The amount of stress that can be easily tolerated varies from person to person. Changes generally cause stress, regardless of the types of change. Too much stress can affect your health. It may lead to physical or emotional problems. Too little stress (boredom) may also become stressful. SUGGESTIONS TO  REDUCE STRESS:  Talk things over with your family and friends. It often is helpful to share your concerns and worries. If you feel your problem is serious, you may want to get help from a professional counselor.  Consider your problems one at a time instead of lumping them all together. Trying to take care of everything at once may seem impossible. List all the things you need to do and then start with the most important one. Set a goal to accomplish 2 or 3 things each day. If you expect to do too many in a single day you will naturally fail, causing you to feel even more stressed.  Do not use alcohol or drugs to relieve stress. Although you may feel better for a short time, they do not remove the problems that caused the stress. They can also be habit forming.  Exercise regularly - at least 3 times per week. Physical exercise can help to relieve that "uptight" feeling and will relax you.  The shortest distance between despair and hope is often a good night's sleep.  Go to bed and get up on time allowing yourself time for appointments without being rushed.  Take a short "time-out" period from any stressful situation that occurs during the day. Close your eyes and take some deep breaths. Starting with the muscles in your face, tense them, hold it for a few seconds, then relax. Repeat this with the muscles in your neck, shoulders, hand, stomach, back and legs.  Take good care of yourself. Eat a balanced diet and get plenty of rest.  Schedule time for having fun. Take a break from your daily routine to relax. HOME CARE INSTRUCTIONS   Call if you feel overwhelmed by your problems and feel you can no longer manage them on your own.  Return immediately if you feel like hurting yourself or someone else. Document Released: 05/10/2001 Document Revised: 02/06/2012 Document Reviewed: 12/31/2007 ExitCare Patient Information 2013 ExitCare, LLC.   Oral Contraception Use Oral contraceptive pills  (OCPs) are medicines that you take to prevent pregnancy. OCPs work by:  Preventing the ovaries from releasing eggs.  Thickening mucus in the lower part of the uterus (cervix), which prevents sperm from entering the uterus.  Thinning the lining of the uterus (endometrium), which prevents a fertilized egg from attaching to the endometrium. OCPs are highly effective when taken exactly as prescribed. However, OCPs do not prevent sexually transmitted infections (STIs). Safe sex practices, such as using condoms while on an OCP, can help prevent STIs. Before taking   OCPs, you may have a physical exam, blood test, and Pap test. A Pap test involves taking a sample of cells from your cervix to check for cancer. Discuss with your health care provider the possible side effects of the OCP you may be prescribed. When you start an OCP, be aware that it can take 2-3 months for your body to adjust to changes in hormone levels. How to take oral contraceptive pills Follow instructions from your health care provider about how to start taking your first cycle of OCPs. Your health care provider may recommend that you:  Start the pill on day 1 of your menstrual period. If you start at this time, you will not need any backup form of birth control (contraception), such as condoms.  Start the pill on the first Sunday after your menstrual period or on the day you get your prescription. In these cases, you will need to use backup contraception for the first week.  Start the pill at any time of your cycle. ? If you take the pill within 5 days of the start of your period, you will not need a backup form of contraception. ? If you start at any other time of your menstrual cycle, you will need to use another form of contraception for 7 days. If your OCP is the type called a minipill, it will protect you from pregnancy after taking it for 2 days (48 hours), and you can stop using backup contraception after that time. After you  have started taking OCPs:  If you forget to take 1 pill, take it as soon as you remember. Take the next pill at the regular time.  If you miss 2 or more pills, call your health care provider. Different pills have different instructions for missed doses. Use backup birth control until your next menstrual period starts.  If you use a 28-day pack that contains inactive pills and you miss 1 of the last 7 pills (pills with no hormones), throw away the rest of the non-hormone pills and start a new pill pack. No matter which day you start the OCP, you will always start a new pack on that same day of the week. Have an extra pack of OCPs and a backup contraceptive method available in case you miss some pills or lose your OCP pack. Follow these instructions at home:  Do not use any products that contain nicotine or tobacco, such as cigarettes and e-cigarettes. If you need help quitting, ask your health care provider.  Always use a condom to protect against STIs. OCPs do not protect against STIs.  Use a calendar to mark the days of your menstrual period.  Read the information and directions that came with your OCP. Talk to your health care provider if you have questions. Contact a health care provider if:  You develop nausea and vomiting.  You have abnormal vaginal discharge or bleeding.  You develop a rash.  You miss your menstrual period. Depending on the type of OCP you are taking, this may be a sign of pregnancy. Ask your health care provider for more information.  You are losing your hair.  You need treatment for mood swings or depression.  You get dizzy when taking the OCP.  You develop acne after taking the OCP.  You become pregnant or think you may be pregnant.  You have diarrhea, constipation, and abdominal pain or cramps.  You miss 2 or more pills. Get help right away if:  You develop chest   pain.  You develop shortness of breath.  You have an uncontrolled or severe  headache.  You develop numbness or slurred speech.  You develop visual or speech problems.  You develop pain, redness, and swelling in your legs.  You develop weakness or numbness in your arms or legs. Summary  Oral contraceptive pills (OCPs) are medicines that you take to prevent pregnancy.  OCPs do not prevent sexually transmitted infections (STIs). Always use a condom to protect against STIs.  When you start an OCP, be aware that it can take 2-3 months for your body to adjust to changes in hormone levels.  Read all the information and directions that come with your OCP. This information is not intended to replace advice given to you by your health care provider. Make sure you discuss any questions you have with your health care provider. Document Revised: 03/08/2019 Document Reviewed: 12/26/2016 Elsevier Patient Education  [place].

## 2020-01-30 ENCOUNTER — Encounter: Payer: Self-pay | Admitting: Certified Nurse Midwife

## 2020-01-30 ENCOUNTER — Other Ambulatory Visit: Payer: Self-pay | Admitting: Certified Nurse Midwife

## 2020-01-30 LAB — CYTOLOGY - PAP: Diagnosis: NEGATIVE

## 2020-01-31 ENCOUNTER — Other Ambulatory Visit: Payer: Self-pay

## 2020-01-31 MED ORDER — FLUCONAZOLE 150 MG PO TABS: ORAL_TABLET | ORAL | 0 refills | Status: DC

## 2020-02-06 DIAGNOSIS — F411 Generalized anxiety disorder: Secondary | ICD-10-CM | POA: Diagnosis not present

## 2020-02-19 ENCOUNTER — Encounter: Payer: Self-pay | Admitting: Certified Nurse Midwife

## 2020-11-04 DIAGNOSIS — Z20822 Contact with and (suspected) exposure to covid-19: Secondary | ICD-10-CM | POA: Diagnosis not present

## 2021-01-13 ENCOUNTER — Telehealth: Payer: Self-pay | Admitting: *Deleted

## 2021-01-13 DIAGNOSIS — Z3041 Encounter for surveillance of contraceptive pills: Secondary | ICD-10-CM

## 2021-01-13 MED ORDER — NORETHINDRONE 0.35 MG PO TABS: 1.0000 | ORAL_TABLET | Freq: Every day | ORAL | 0 refills | Status: DC

## 2021-01-13 NOTE — Telephone Encounter (Addendum)
Patient called left message in triage voicemail requesting refill on Rx. I asked patient to call me to confirm it is the Micronor 0.25 mg tablet.  Annual exam scheduled on 02/01/21

## 2021-01-25 DIAGNOSIS — F411 Generalized anxiety disorder: Secondary | ICD-10-CM | POA: Diagnosis not present

## 2021-02-01 ENCOUNTER — Ambulatory Visit: Payer: BC Managed Care – PPO | Admitting: Obstetrics and Gynecology

## 2021-02-01 ENCOUNTER — Other Ambulatory Visit: Payer: Self-pay

## 2021-02-01 ENCOUNTER — Encounter: Payer: Self-pay | Admitting: Obstetrics and Gynecology

## 2021-02-01 VITALS — BP 122/68 | HR 73 | Ht 59.0 in | Wt 168.0 lb

## 2021-02-01 DIAGNOSIS — Z3041 Encounter for surveillance of contraceptive pills: Secondary | ICD-10-CM

## 2021-02-01 DIAGNOSIS — Z01419 Encounter for gynecological examination (general) (routine) without abnormal findings: Secondary | ICD-10-CM | POA: Diagnosis not present

## 2021-02-01 DIAGNOSIS — Z113 Encounter for screening for infections with a predominantly sexual mode of transmission: Secondary | ICD-10-CM

## 2021-02-01 DIAGNOSIS — Z0000 Encounter for general adult medical examination without abnormal findings: Secondary | ICD-10-CM

## 2021-02-01 LAB — CBC
MPV: 10.3 fL (ref 7.5–12.5)
Platelets: 404 10*3/uL — ABNORMAL HIGH (ref 140–400)

## 2021-02-01 MED ORDER — NORETHINDRONE 0.35 MG PO TABS: 1.0000 | ORAL_TABLET | Freq: Every day | ORAL | 3 refills | Status: DC

## 2021-02-01 NOTE — Patient Instructions (Signed)

## 2021-02-01 NOTE — Progress Notes (Signed)
20 y.o. G0P0000 Single White or Caucasian Not Hispanic or Latino female here for annual exam.  With the micronor she has been getting her cycle lightly every other week for 3 day. This has been going on since the summer. Overall tolerable. She had an increase in migraines on OCP's, no headaches on the micronor. Sexually active, same partner x 2 years. No dyspareunia.  Period Cycle (Days): 14 Period Duration (Days): 3 Period Pattern: (!) Irregular Menstrual Flow: Light Menstrual Control: Other (Comment) (menstral cup) Dysmenorrhea: (!) Moderate Dysmenorrhea Symptoms: Cramping  Patient's last menstrual period was 01/20/2021.          Sexually active: Yes.    The current method of family planning is oral progesterone-only contraceptive.    Exercising: Yes.    walking cardio Smoker:  no  Health Maintenance: Pap:  01/29/20 WNL  History of abnormal Pap:  no MMG:  None  BMD:   None  Colonoscopy: none  TDaP:  2013 Gardasil: no, counseled, information given.    reports that she has never smoked. She has never used smokeless tobacco. She reports that she does not drink alcohol and does not use drugs. Occasional ETOH. She is a Holiday representative at [employer], Glass blower/designer in [employer]. She wants to go on to get her PhD in Immunology.   Past Medical History:  Diagnosis Date  . Anxiety   . Lactose intolerance   . Migraines    no [place]  . Sensation of lump in throat     History reviewed. No pertinent surgical history.  Current Outpatient Medications  Medication Sig Dispense Refill  . cetirizine (ZYRTEC) 10 MG tablet Take 10 mg by mouth as needed for allergies.    Marland Kitchen FLUoxetine (PROZAC) 20 MG tablet Take 20 mg by mouth daily.    . hydrOXYzine (VISTARIL) 25 MG capsule TAKE 1 TABLET EVERY 6 HOURS AS NEEDED FOR ANXIETY  1  . ibuprofen (ADVIL) 800 MG tablet Take 1 tablet (800 mg total) by mouth every 8 (eight) hours as needed. 90 tablet 1  . norethindrone (MICRONOR) 0.35 MG tablet Take 1 tablet (0.35 mg total) by  mouth daily. 28 tablet 0   No current facility-administered medications for this visit.    Family History  Problem Relation Age of Onset  . Uterine cancer Maternal Grandmother 49  . Heart disease Maternal Grandfather        quad bypass  . Alzheimer's disease Paternal Grandfather   . Ulcers Paternal Uncle   . Osteopenia Mother   . Hypertension Father     Review of Systems  Psychiatric/Behavioral: The patient is nervous/anxious.   All other systems reviewed and are negative.   Exam:   BP 122/68   Pulse 73   Ht 4\' 11"  (1.499 m)   Wt 168 lb (76.2 kg)   LMP 01/20/2021   SpO2 100%   BMI 33.93 kg/m   Weight change: @WEIGHTCHANGE @ Height:   Height: 4\' 11"  (149.9 cm)  Ht Readings from Last 3 Encounters:  02/01/21 4\' 11"  (1.499 m)  01/29/20 4' 11.25" (1.505 m)  04/18/19 4\' 11"  (1.499 m)    General appearance: alert, cooperative and appears stated age Head: Normocephalic, without obvious abnormality, atraumatic Neck: no adenopathy, supple, symmetrical, trachea midline and thyroid normal to inspection and palpation Lungs: clear to auscultation bilaterally Cardiovascular: regular rate and rhythm Breasts: normal appearance, no masses or tenderness Abdomen: soft, non-tender; non distended,  no masses,  no organomegaly Extremities: extremities normal, atraumatic, no cyanosis or edema Skin: Skin  color, texture, turgor normal. No rashes or lesions Lymph nodes: Cervical, supraclavicular, and axillary nodes normal. No abnormal inguinal nodes palpated Neurologic: Grossly normal   Pelvic: External genitalia:  no lesions              Urethra:  normal appearing urethra with no masses, tenderness or lesions              Bartholins and Skenes: normal                 Vagina: normal appearing vagina with normal color and discharge, no lesions              Cervix: no lesions               Bimanual Exam:  Uterus:  normal size, contour, position, consistency, mobility, non-tender and  anteverted              Adnexa: no mass, fullness, tenderness               Rectovaginal: Confirms               Anus:  normal sphincter tone, no lesions  Carolynn Serve chaperoned for the exam.  1. Well woman exam Discussed breast self exam Discussed calcium and vit D intake   2. Screening examination for STD (sexually transmitted disease) - RPR - HIV Antibody (routine testing w rflx) - SURESWAB CT/NG/T. vaginalis  3. Encounter for surveillance of contraceptive pills She has frequent light bleeding, but overall doing well - norethindrone (MICRONOR) 0.35 MG tablet; Take 1 tablet (0.35 mg total) by mouth daily.  Dispense: 84 tablet; Refill: 3  4. Laboratory exam ordered as part of routine general medical examination - CBC - Comprehensive metabolic panel - Lipid panel

## 2021-02-02 LAB — CBC
HCT: 43.4 % (ref 35.0–45.0)
Hemoglobin: 14.3 g/dL (ref 11.7–15.5)
MCH: 29.1 pg (ref 27.0–33.0)
MCHC: 32.9 g/dL (ref 32.0–36.0)
MCV: 88.2 fL (ref 80.0–100.0)
RBC: 4.92 10*6/uL (ref 3.80–5.10)
RDW: 13.4 % (ref 11.0–15.0)
WBC: 10 10*3/uL (ref 3.8–10.8)

## 2021-02-02 LAB — COMPREHENSIVE METABOLIC PANEL
AG Ratio: 1.6 (calc) (ref 1.0–2.5)
ALT: 48 U/L — ABNORMAL HIGH (ref 6–29)
AST: 30 U/L (ref 10–30)
Albumin: 4.7 g/dL (ref 3.6–5.1)
Alkaline phosphatase (APISO): 85 U/L (ref 31–125)
BUN: 16 mg/dL (ref 7–25)
CO2: 24 mmol/L (ref 20–32)
Calcium: 10.2 mg/dL (ref 8.6–10.2)
Chloride: 104 mmol/L (ref 98–110)
Creat: 0.6 mg/dL (ref 0.50–1.10)
Globulin: 2.9 g/dL (calc) (ref 1.9–3.7)
Glucose, Bld: 81 mg/dL (ref 65–99)
Potassium: 4.3 mmol/L (ref 3.5–5.3)
Sodium: 138 mmol/L (ref 135–146)
Total Bilirubin: 0.6 mg/dL (ref 0.2–1.2)
Total Protein: 7.6 g/dL (ref 6.1–8.1)

## 2021-02-02 LAB — LIPID PANEL
Cholesterol: 217 mg/dL — ABNORMAL HIGH (ref <200)
HDL: 27 mg/dL — ABNORMAL LOW (ref >=50)
Non-HDL Cholesterol (Calc): 190 mg/dL (calc) — ABNORMAL HIGH (ref <130)
Total CHOL/HDL Ratio: 8 (calc) — ABNORMAL HIGH (ref <5.0)
Triglycerides: 480 mg/dL — ABNORMAL HIGH (ref <150)

## 2021-02-02 LAB — RPR: RPR Ser Ql: NONREACTIVE

## 2021-02-02 LAB — HIV ANTIBODY (ROUTINE TESTING W REFLEX): HIV 1&2 Ab, 4th Generation: NONREACTIVE

## 2021-02-03 LAB — SURESWAB CT/NG/T. VAGINALIS
C. trachomatis RNA, TMA: NOT DETECTED
N. gonorrhoeae RNA, TMA: NOT DETECTED
Trichomonas vaginalis RNA: NOT DETECTED

## 2021-05-17 DIAGNOSIS — Z1159 Encounter for screening for other viral diseases: Secondary | ICD-10-CM | POA: Diagnosis not present

## 2021-05-17 DIAGNOSIS — Z23 Encounter for immunization: Secondary | ICD-10-CM | POA: Diagnosis not present

## 2021-05-17 DIAGNOSIS — Z111 Encounter for screening for respiratory tuberculosis: Secondary | ICD-10-CM | POA: Diagnosis not present

## 2021-06-11 DIAGNOSIS — Z111 Encounter for screening for respiratory tuberculosis: Secondary | ICD-10-CM | POA: Diagnosis not present

## 2021-06-11 DIAGNOSIS — Z1159 Encounter for screening for other viral diseases: Secondary | ICD-10-CM | POA: Diagnosis not present

## 2021-11-10 DIAGNOSIS — F411 Generalized anxiety disorder: Secondary | ICD-10-CM | POA: Diagnosis not present

## 2021-12-22 ENCOUNTER — Other Ambulatory Visit: Payer: Self-pay

## 2021-12-22 DIAGNOSIS — Z3041 Encounter for surveillance of contraceptive pills: Secondary | ICD-10-CM

## 2021-12-22 MED ORDER — NORETHINDRONE 0.35 MG PO TABS: 1.0000 | ORAL_TABLET | Freq: Every day | ORAL | 0 refills | Status: DC

## 2021-12-22 NOTE — Telephone Encounter (Signed)
AEX 02/01/2021. Scheduled 03/16/22.  Patient called requesting refill on her bcps as she stated she will not have enough Rx until her scheduled AEX.

## 2022-01-07 ENCOUNTER — Other Ambulatory Visit: Payer: Self-pay

## 2022-01-07 DIAGNOSIS — Z3041 Encounter for surveillance of contraceptive pills: Secondary | ICD-10-CM

## 2022-01-07 NOTE — Telephone Encounter (Signed)
Fax received for refill on BC on 01/06/22. Denied request and faxed back due to 45mo supply being sent on 12/22/21.

## 2022-03-10 NOTE — Progress Notes (Signed)
21 y.o. G0P0000 Single White or Caucasian Not Hispanic or Latino female here for annual exam.  She is on POP, bleeds frequently but light. Tolerable. Previously had an increase in migraines on OCP's. ?Sexually active, same partner x 3 years (engaged). No dyspareunia.  ?Period Duration (Days): 4-5 ?Period Pattern: (!) Irregular (her cycle length has been from 22 days to 13) ?Menstrual Flow: Light, Moderate ?Menstrual Control: Other (Comment) (Diva cup) ?Menstrual Control Change Freq (Hours): 8 ?Dysmenorrhea: (!) Moderate ?Dysmenorrhea Symptoms: Cramping, Diarrhea ? ?Patient's last menstrual period was 03/05/2022.          ?Sexually active: Yes.    ?The current method of family planning is POP (progesterone).    ?Exercising: Yes.     Cardio ?Smoker:  no ? ?Health Maintenance: ?Pap:  01/29/20 WNL  ?History of abnormal Pap:  no ?MMG:  none  ?BMD:   none  ?Colonoscopy: none  ?TDaP:  Fall 2022 ?Gardasil: none  ? ? reports that she has never smoked. She has never used smokeless tobacco. She reports that she does not drink alcohol and does not use drugs. 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 Got engaged in 11/22, will get married in the fall of 2024. ? ?Past Medical History:  ?Diagnosis Date  ? Anxiety   ? Lactose intolerance   ? Migraines   ? no auras  ? Sensation of lump in throat   ? ? ?History reviewed. No pertinent surgical history. ? ?Current Outpatient Medications  ?Medication Sig Dispense Refill  ? cetirizine (ZYRTEC) 10 MG tablet Take 10 mg by mouth as needed for allergies.    ? FLUoxetine (PROZAC) 20 MG tablet Take 20 mg by mouth daily.    ? hydrOXYzine (VISTARIL) 25 MG capsule TAKE 1 TABLET EVERY 6 HOURS AS NEEDED FOR ANXIETY  1  ? ibuprofen (ADVIL) 800 MG tablet Take 1 tablet (800 mg total) by mouth every 8 (eight) hours as needed. 90 tablet 1  ? norethindrone (MICRONOR) 0.35 MG tablet Take 1 tablet (0.35 mg total) by mouth daily. 84 tablet 0  ? ?No current facility-administered medications for this  visit.  ? ? ?Family History  ?Problem Relation Age of Onset  ? Uterine cancer Maternal Grandmother 2  ? Heart disease Maternal Grandfather   ?     quad bypass  ? Alzheimer's disease Paternal Grandfather   ? Ulcers Paternal Uncle   ? Osteopenia Mother   ? Hypertension Father   ? ? ?Review of Systems  ?All other systems reviewed and are negative. ? ?Exam:   ?BP 128/82   Pulse 72   Ht 4\' 11"  (1.499 m)   Wt 169 lb 12.8 oz (77 kg)   LMP 03/05/2022   SpO2 100%   BMI 34.30 kg/m?   Weight change: @WEIGHTCHANGE @ Height:   Height: 4\' 11"  (149.9 cm)  ?Ht Readings from Last 3 Encounters:  ?03/16/22 4\' 11"  (1.499 m)  ?02/01/21 4\' 11"  (1.499 m)  ?01/29/20 4' 11.25" (1.505 m)  ? ? ?General appearance: alert, cooperative and appears stated age ?Head: Normocephalic, without obvious abnormality, atraumatic ?Neck: no adenopathy, supple, symmetrical, trachea midline and thyroid normal to inspection and palpation ?Lungs: clear to auscultation bilaterally ?Cardiovascular: regular rate and rhythm ?Breasts: normal appearance, no masses or tenderness ?Abdomen: soft, non-tender; non distended,  no masses,  no organomegaly ?Extremities: extremities normal, atraumatic, no cyanosis or edema ?Skin: Skin color, texture, turgor normal. No rashes or lesions ?Lymph nodes: Cervical, supraclavicular, and axillary nodes normal. ?No abnormal inguinal  nodes palpated ?Neurologic: Grossly normal ? ? ?Pelvic: External genitalia:  no lesions ?             Urethra:  normal appearing urethra with no masses, tenderness or lesions ?             Bartholins and Skenes: normal    ?             Vagina: normal appearing vagina with normal color and discharge, no lesions ?             Cervix: no lesions ?              ?Bimanual Exam:  Uterus:   no masses or tenderness ?             Adnexa: no mass, fullness, tenderness ?              Rectovaginal: Confirms ?              Anus:  normal sphincter tone, no lesions ? ?Wandra Scot, CMA chaperoned for the exam. ? ?1.  Well woman exam ?Discussed breast self exam ?Discussed calcium and vit D intake ?Counseled on the gardasil vaccination, information given ?No pap this year ? ?2. Encounter for surveillance of contraceptive pills ?Doing well on POP, wants to continue ?- norethindrone (MICRONOR) 0.35 MG tablet; Take 1 tablet (0.35 mg total) by mouth daily.  Dispense: 84 tablet; Refill: 3 ? ?3. Lipids abnormal ?-Discussed that her lipids were abnormal last year and I suspect they will be again. She will likely need to f/u with her primary for management. Discussed the risk of heart disease ?- Lipid panel ? ?4. Elevated platelet count ?- CBC ? ?5. Elevated LFTs ?- Comprehensive metabolic panel ? ?6. Screening examination for STD (sexually transmitted disease) ?Declines blood work ?- SURESWAB CT/NG/T. vaginalis ? ? ?

## 2022-03-15 ENCOUNTER — Other Ambulatory Visit: Payer: Self-pay | Admitting: Obstetrics and Gynecology

## 2022-03-15 DIAGNOSIS — Z3041 Encounter for surveillance of contraceptive pills: Secondary | ICD-10-CM

## 2022-03-16 ENCOUNTER — Encounter: Payer: Self-pay | Admitting: Obstetrics and Gynecology

## 2022-03-16 ENCOUNTER — Ambulatory Visit (INDEPENDENT_AMBULATORY_CARE_PROVIDER_SITE_OTHER): Payer: BC Managed Care – PPO | Admitting: Obstetrics and Gynecology

## 2022-03-16 VITALS — BP 128/82 | HR 72 | Ht 59.0 in | Wt 169.8 lb

## 2022-03-16 DIAGNOSIS — Z113 Encounter for screening for infections with a predominantly sexual mode of transmission: Secondary | ICD-10-CM | POA: Diagnosis not present

## 2022-03-16 DIAGNOSIS — R7989 Other specified abnormal findings of blood chemistry: Secondary | ICD-10-CM | POA: Diagnosis not present

## 2022-03-16 DIAGNOSIS — E7889 Other lipoprotein metabolism disorders: Secondary | ICD-10-CM

## 2022-03-16 DIAGNOSIS — Z3041 Encounter for surveillance of contraceptive pills: Secondary | ICD-10-CM

## 2022-03-16 DIAGNOSIS — Z01419 Encounter for gynecological examination (general) (routine) without abnormal findings: Secondary | ICD-10-CM

## 2022-03-16 MED ORDER — NORETHINDRONE 0.35 MG PO TABS: 1.0000 | ORAL_TABLET | Freq: Every day | ORAL | 3 refills | Status: DC

## 2022-03-16 NOTE — Patient Instructions (Signed)

## 2022-03-17 LAB — COMPREHENSIVE METABOLIC PANEL
AG Ratio: 1.5 (calc) (ref 1.0–2.5)
ALT: 38 U/L — ABNORMAL HIGH (ref 6–29)
AST: 24 U/L (ref 10–30)
Albumin: 4.4 g/dL (ref 3.6–5.1)
Alkaline phosphatase (APISO): 88 U/L (ref 31–125)
BUN: 17 mg/dL (ref 7–25)
CO2: 22 mmol/L (ref 20–32)
Calcium: 9.4 mg/dL (ref 8.6–10.2)
Chloride: 106 mmol/L (ref 98–110)
Creat: 0.66 mg/dL (ref 0.50–0.96)
Globulin: 2.9 g/dL (calc) (ref 1.9–3.7)
Glucose, Bld: 93 mg/dL (ref 65–99)
Potassium: 3.9 mmol/L (ref 3.5–5.3)
Sodium: 139 mmol/L (ref 135–146)
Total Bilirubin: 0.6 mg/dL (ref 0.2–1.2)
Total Protein: 7.3 g/dL (ref 6.1–8.1)

## 2022-03-17 LAB — CBC
HCT: 43.2 % (ref 35.0–45.0)
Hemoglobin: 14.5 g/dL (ref 11.7–15.5)
MCH: 30.1 pg (ref 27.0–33.0)
MCHC: 33.6 g/dL (ref 32.0–36.0)
MCV: 89.8 fL (ref 80.0–100.0)
MPV: 10.6 fL (ref 7.5–12.5)
Platelets: 382 10*3/uL (ref 140–400)
RBC: 4.81 10*6/uL (ref 3.80–5.10)
RDW: 12.8 % (ref 11.0–15.0)
WBC: 12.4 10*3/uL — ABNORMAL HIGH (ref 3.8–10.8)

## 2022-03-17 LAB — SURESWAB CT/NG/T. VAGINALIS
C. trachomatis RNA, TMA: NOT DETECTED
N. gonorrhoeae RNA, TMA: NOT DETECTED
Trichomonas vaginalis RNA: NOT DETECTED

## 2022-03-17 LAB — LIPID PANEL
Cholesterol: 228 mg/dL — ABNORMAL HIGH (ref <200)
HDL: 25 mg/dL — ABNORMAL LOW (ref >=50)
Non-HDL Cholesterol (Calc): 203 mg/dL (calc) — ABNORMAL HIGH (ref <130)
Total CHOL/HDL Ratio: 9.1 (calc) — ABNORMAL HIGH (ref <5.0)
Triglycerides: 746 mg/dL — ABNORMAL HIGH (ref <150)

## 2022-05-23 DIAGNOSIS — F411 Generalized anxiety disorder: Secondary | ICD-10-CM | POA: Diagnosis not present

## 2022-10-04 DIAGNOSIS — R5383 Other fatigue: Secondary | ICD-10-CM | POA: Diagnosis not present

## 2022-10-04 DIAGNOSIS — R519 Headache, unspecified: Secondary | ICD-10-CM | POA: Diagnosis not present

## 2022-10-07 DIAGNOSIS — H3554 Dystrophies primarily involving the retinal pigment epithelium: Secondary | ICD-10-CM | POA: Diagnosis not present

## 2022-10-11 DIAGNOSIS — R5383 Other fatigue: Secondary | ICD-10-CM | POA: Diagnosis not present

## 2022-10-11 DIAGNOSIS — K7689 Other specified diseases of liver: Secondary | ICD-10-CM | POA: Diagnosis not present

## 2022-11-04 DIAGNOSIS — F411 Generalized anxiety disorder: Secondary | ICD-10-CM | POA: Diagnosis not present

## 2022-11-04 DIAGNOSIS — F4322 Adjustment disorder with anxiety: Secondary | ICD-10-CM | POA: Diagnosis not present

## 2023-01-10 DIAGNOSIS — H3554 Dystrophies primarily involving the retinal pigment epithelium: Secondary | ICD-10-CM | POA: Diagnosis not present

## 2023-01-11 DIAGNOSIS — R945 Abnormal results of liver function studies: Secondary | ICD-10-CM | POA: Diagnosis not present

## 2023-01-11 DIAGNOSIS — E785 Hyperlipidemia, unspecified: Secondary | ICD-10-CM | POA: Diagnosis not present

## 2023-01-20 DIAGNOSIS — E7800 Pure hypercholesterolemia, unspecified: Secondary | ICD-10-CM | POA: Diagnosis not present

## 2023-03-17 ENCOUNTER — Other Ambulatory Visit: Payer: Self-pay

## 2023-03-17 DIAGNOSIS — Z3041 Encounter for surveillance of contraceptive pills: Secondary | ICD-10-CM

## 2023-03-17 MED ORDER — NORETHINDRONE 0.35 MG PO TABS: 1.0000 | ORAL_TABLET | Freq: Every day | ORAL | 0 refills | Status: DC

## 2023-03-17 NOTE — Telephone Encounter (Signed)
Medication refill request: micronor Last AEX:  03-16-22 Next AEX: 03-30-23 Last MMG (if hormonal medication request): none Refill authorized: please approve if appropriate

## 2023-03-20 ENCOUNTER — Ambulatory Visit: Payer: BC Managed Care – PPO | Admitting: Obstetrics and Gynecology

## 2023-03-29 ENCOUNTER — Encounter: Payer: Self-pay | Admitting: Obstetrics and Gynecology

## 2023-03-29 NOTE — Progress Notes (Unsigned)
22 y.o. G0P0000 Single White or Caucasian Not Hispanic or Latino female here for annual exam.  On POP, doing fine, irregular but light cycles. No dyspareunia.  Period Cycle (Days):  (13-25) Period Duration (Days): 4-5 Menstrual Flow: Light Menstrual Control: Other (Comment) (disc) Dysmenorrhea: (!) Moderate Dysmenorrhea Symptoms: Cramping, Diarrhea  Since last year she has had f/u lab work with a primary care provider and they are following her labs. She is working on weight loss. She will f/u with them.   Patient's last menstrual period was 03/22/2023 (approximate).          Sexually active: Yes.    The current method of family planning is oral progesterone-only contraceptive.    Exercising: Yes.     Cardio, 3-4x a week Smoker: no  Health Maintenance: Pap: 01/29/2020-WNL History of abnormal Pap: no MMG: n/a BMD: n/a Colonoscopy: n/a TDaP: Fall of 22' Gardasil: never   reports that she has never smoked. She has never used smokeless tobacco. She reports that she does not drink alcohol and does not use drugs.  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 Ultimately will go on and get her PhD.  Getting married in the fall.   Past Medical History:  Diagnosis Date   Anxiety    Lactose intolerance    Migraines    no auras   Sensation of lump in throat     History reviewed. No pertinent surgical history.  Current Outpatient Medications  Medication Sig Dispense Refill   acetaminophen (TYLENOL) 500 MG tablet 1 tablet as needed Orally every 6 hrs     cetirizine (ZYRTEC) 10 MG tablet Take 10 mg by mouth as needed for allergies.     FLUoxetine (PROZAC) 20 MG tablet Take 20 mg by mouth daily.     hydrOXYzine (VISTARIL) 25 MG capsule TAKE 1 TABLET EVERY 6 HOURS AS NEEDED FOR ANXIETY  1   ibuprofen (ADVIL) 800 MG tablet Take 1 tablet (800 mg total) by mouth every 8 (eight) hours as needed. 90 tablet 1   norethindrone (MICRONOR) 0.35 MG tablet Take 1 tablet  (0.35 mg total) by mouth daily. 84 tablet 0   No current facility-administered medications for this visit.    Family History  Problem Relation Age of Onset   Uterine cancer Maternal Grandmother 61   Heart disease Maternal Grandfather        quad bypass   Alzheimer's disease Paternal Grandfather    Ulcers Paternal Uncle    Osteopenia Mother    Hypertension Father     Review of Systems  All other systems reviewed and are negative.   Exam:   BP 124/80   Pulse 83   Ht 4' 11.5" (1.511 m)   Wt 158 lb (71.7 kg)   LMP 03/22/2023 (Approximate)   SpO2 96%   BMI 31.38 kg/m   Weight change: @WEIGHTCHANGE @ Height:   Height: 4' 11.5" (151.1 cm)  Ht Readings from Last 3 Encounters:  03/30/23 4' 11.5" (1.511 m)  03/16/22 4\' 11"  (1.499 m)  02/01/21 4\' 11"  (1.499 m)    General appearance: alert, cooperative and appears stated age Head: Normocephalic, without obvious abnormality, atraumatic Neck: no adenopathy, supple, symmetrical, trachea midline and thyroid normal to inspection and palpation Lungs: clear to auscultation bilaterally Cardiovascular: regular rate and rhythm Breasts: normal appearance, no masses or tenderness Abdomen: soft, non-tender; non distended,  no masses,  no organomegaly Extremities: extremities normal, atraumatic, no cyanosis or edema Skin: Skin color, texture, turgor  normal. No rashes or lesions Lymph nodes: Cervical, supraclavicular, and axillary nodes normal. No abnormal inguinal nodes palpated Neurologic: Grossly normal   Pelvic: External genitalia:  no lesions              Urethra:  normal appearing urethra with no masses, tenderness or lesions              Bartholins and Skenes: normal                 Vagina: normal appearing vagina with normal color and discharge, no lesions              Cervix: no lesions               Bimanual Exam:  Uterus:  normal size, contour, position, consistency, mobility, non-tender              Adnexa: no mass, fullness,  tenderness               Rectovaginal: Confirms               Anus:  normal sphincter tone, no lesions  Jodelle Red, CMA chaperoned for the exam.  1. Well woman exam Discussed breast self exam Discussed calcium and vit D intake Labs with primary  2. Screening for cervical cancer - Cytology - PAP  3. Screening examination for STD (sexually transmitted disease) Declines blood work - Cytology - PAP  4. Encounter for surveillance of contraceptive pills Doing well - norethindrone (MICRONOR) 0.35 MG tablet; Take 1 tablet (0.35 mg total) by mouth daily.  Dispense: 84 tablet; Refill: 3  5. Immunization counseling - HPV 9-valent vaccine,Recombinat

## 2023-03-30 ENCOUNTER — Encounter: Payer: Self-pay | Admitting: Obstetrics and Gynecology

## 2023-03-30 ENCOUNTER — Ambulatory Visit (INDEPENDENT_AMBULATORY_CARE_PROVIDER_SITE_OTHER): Payer: BC Managed Care – PPO | Admitting: Obstetrics and Gynecology

## 2023-03-30 ENCOUNTER — Other Ambulatory Visit (HOSPITAL_COMMUNITY)
Admission: RE | Admit: 2023-03-30 | Discharge: 2023-03-30 | Disposition: A | Discharge status: Home / Self Care | Payer: BC Managed Care – PPO | Source: Ambulatory Visit | Attending: Obstetrics and Gynecology | Admitting: Obstetrics and Gynecology

## 2023-03-30 VITALS — BP 124/80 | HR 83 | Ht 59.5 in | Wt 158.0 lb

## 2023-03-30 DIAGNOSIS — Z23 Encounter for immunization: Secondary | ICD-10-CM | POA: Diagnosis not present

## 2023-03-30 DIAGNOSIS — Z01419 Encounter for gynecological examination (general) (routine) without abnormal findings: Secondary | ICD-10-CM

## 2023-03-30 DIAGNOSIS — Z7185 Encounter for immunization safety counseling: Secondary | ICD-10-CM

## 2023-03-30 DIAGNOSIS — Z113 Encounter for screening for infections with a predominantly sexual mode of transmission: Secondary | ICD-10-CM | POA: Insufficient documentation

## 2023-03-30 DIAGNOSIS — Z124 Encounter for screening for malignant neoplasm of cervix: Secondary | ICD-10-CM | POA: Diagnosis not present

## 2023-03-30 DIAGNOSIS — Z3041 Encounter for surveillance of contraceptive pills: Secondary | ICD-10-CM

## 2023-03-30 MED ORDER — NORETHINDRONE 0.35 MG PO TABS: 1.0000 | ORAL_TABLET | Freq: Every day | ORAL | 3 refills | Status: AC

## 2023-03-30 NOTE — Patient Instructions (Signed)

## 2023-04-03 LAB — CYTOLOGY - PAP
Adequacy: ABSENT
Chlamydia: NEGATIVE
Comment: NEGATIVE
Comment: NORMAL
Diagnosis: NEGATIVE
Diagnosis: REACTIVE
Neisseria Gonorrhea: NEGATIVE

## 2023-04-10 DIAGNOSIS — H3554 Dystrophies primarily involving the retinal pigment epithelium: Secondary | ICD-10-CM | POA: Diagnosis not present

## 2023-05-30 ENCOUNTER — Ambulatory Visit (INDEPENDENT_AMBULATORY_CARE_PROVIDER_SITE_OTHER): Payer: BC Managed Care – PPO | Admitting: *Deleted

## 2023-05-30 DIAGNOSIS — Z23 Encounter for immunization: Secondary | ICD-10-CM

## 2023-06-05 ENCOUNTER — Ambulatory Visit: Payer: BC Managed Care – PPO

## 2023-06-12 ENCOUNTER — Telehealth: Payer: Self-pay

## 2023-06-12 NOTE — Telephone Encounter (Signed)
JJ pt calling to report that she needs refill on her micronor BCPs.   I inquired if pt is requesting refills on her app or if she has contacted her pharmacy. She reported by the app.  I advised her to call the pharmacy because Dr. Oscar La sent a new rx for 1 year in 03/2023 and its likely that she doesn't have refills left of an old rx but that the new rx is under a new rx #.   Pt voiced understanding and will cb if needs further assistance. Will route to provider for final review and close.

## 2023-10-03 ENCOUNTER — Ambulatory Visit (INDEPENDENT_AMBULATORY_CARE_PROVIDER_SITE_OTHER): Payer: BC Managed Care – PPO

## 2023-10-03 DIAGNOSIS — Z23 Encounter for immunization: Secondary | ICD-10-CM
